# Patient Record
Sex: Male | Born: 1985 | Race: Black or African American | Hispanic: No | Marital: Single | Smoking: Current every day smoker
Health system: Southern US, Community
[De-identification: ages and names within clinical notes are randomized; demographics above are authoritative.]

---

## 2007-12-24 ENCOUNTER — Emergency Department (HOSPITAL_COMMUNITY): Admission: EM | Admit: 2007-12-24 | Discharge: 2007-12-24 | Payer: Self-pay | Admitting: Emergency Medicine

## 2008-01-01 ENCOUNTER — Emergency Department (HOSPITAL_COMMUNITY): Admission: EM | Admit: 2008-01-01 | Discharge: 2008-01-01 | Payer: Self-pay | Admitting: Emergency Medicine

## 2010-03-28 ENCOUNTER — Emergency Department (HOSPITAL_COMMUNITY): Admission: EM | Admit: 2010-03-28 | Discharge: 2010-03-28 | Payer: Self-pay | Admitting: Emergency Medicine

## 2011-03-27 ENCOUNTER — Emergency Department (HOSPITAL_COMMUNITY)
Admission: EM | Admit: 2011-03-27 | Discharge: 2011-03-28 | Disposition: A | Discharge status: Home / Self Care | Payer: Self-pay | Attending: Emergency Medicine | Admitting: Emergency Medicine

## 2011-03-27 DIAGNOSIS — R599 Enlarged lymph nodes, unspecified: Secondary | ICD-10-CM | POA: Insufficient documentation

## 2011-03-27 DIAGNOSIS — R509 Fever, unspecified: Secondary | ICD-10-CM | POA: Insufficient documentation

## 2011-03-27 DIAGNOSIS — J039 Acute tonsillitis, unspecified: Secondary | ICD-10-CM | POA: Insufficient documentation

## 2011-03-27 LAB — DIFFERENTIAL
Eosinophils Absolute: 0 10*3/uL (ref 0.0–0.7)
Lymphs Abs: 1.7 10*3/uL (ref 0.7–4.0)
Monocytes Absolute: 1.5 10*3/uL — ABNORMAL HIGH (ref 0.1–1.0)
Neutro Abs: 7.4 10*3/uL (ref 1.7–7.7)

## 2011-03-28 LAB — CBC
MCH: 25.1 pg — ABNORMAL LOW (ref 26.0–34.0)
RBC: 5.3 MIL/uL — ABNORMAL HIGH (ref 3.87–5.11)
WBC: 10.6 10*3/uL — ABNORMAL HIGH (ref 4.0–10.5)

## 2011-03-28 LAB — MONONUCLEOSIS SCREEN: Mono Screen: NEGATIVE

## 2018-10-15 ENCOUNTER — Emergency Department (HOSPITAL_COMMUNITY)
Admission: EM | Admit: 2018-10-15 | Discharge: 2018-10-15 | Disposition: A | Discharge status: Home / Self Care | Payer: Self-pay | Attending: Emergency Medicine | Admitting: Emergency Medicine

## 2018-10-15 ENCOUNTER — Other Ambulatory Visit: Payer: Self-pay

## 2018-10-15 ENCOUNTER — Encounter (HOSPITAL_COMMUNITY): Payer: Self-pay

## 2018-10-15 ENCOUNTER — Emergency Department (HOSPITAL_COMMUNITY): Payer: Self-pay

## 2018-10-15 DIAGNOSIS — W3400XA Accidental discharge from unspecified firearms or gun, initial encounter: Secondary | ICD-10-CM | POA: Insufficient documentation

## 2018-10-15 DIAGNOSIS — S71131A Puncture wound without foreign body, right thigh, initial encounter: Secondary | ICD-10-CM | POA: Insufficient documentation

## 2018-10-15 DIAGNOSIS — F17210 Nicotine dependence, cigarettes, uncomplicated: Secondary | ICD-10-CM | POA: Insufficient documentation

## 2018-10-15 DIAGNOSIS — Y999 Unspecified external cause status: Secondary | ICD-10-CM | POA: Insufficient documentation

## 2018-10-15 DIAGNOSIS — Y9301 Activity, walking, marching and hiking: Secondary | ICD-10-CM | POA: Insufficient documentation

## 2018-10-15 DIAGNOSIS — S0083XA Contusion of other part of head, initial encounter: Secondary | ICD-10-CM | POA: Insufficient documentation

## 2018-10-15 DIAGNOSIS — Y92410 Unspecified street and highway as the place of occurrence of the external cause: Secondary | ICD-10-CM | POA: Insufficient documentation

## 2018-10-15 LAB — COMPREHENSIVE METABOLIC PANEL
ALBUMIN: 4.4 g/dL (ref 3.5–5.0)
ALT: 19 U/L (ref 0–44)
AST: 36 U/L (ref 15–41)
Alkaline Phosphatase: 48 U/L (ref 38–126)
Anion gap: 18 — ABNORMAL HIGH (ref 5–15)
BUN: 8 mg/dL (ref 6–20)
CHLORIDE: 108 mmol/L (ref 98–111)
CO2: 14 mmol/L — ABNORMAL LOW (ref 22–32)
Calcium: 9.3 mg/dL (ref 8.9–10.3)
Creatinine, Ser: 1.33 mg/dL — ABNORMAL HIGH (ref 0.61–1.24)
GFR calc Af Amer: >60 mL/min (ref >60)
GFR calc non Af Amer: >60 mL/min (ref >60)
GLUCOSE: 137 mg/dL — AB (ref 70–99)
POTASSIUM: 3.2 mmol/L — AB (ref 3.5–5.1)
Sodium: 140 mmol/L (ref 135–145)
Total Bilirubin: 1.7 mg/dL — ABNORMAL HIGH (ref 0.3–1.2)
Total Protein: 7.5 g/dL (ref 6.5–8.1)

## 2018-10-15 LAB — CBC WITH DIFFERENTIAL/PLATELET
ABS IMMATURE GRANULOCYTES: 0.01 10*3/uL (ref 0.00–0.07)
BASOS ABS: 0.1 10*3/uL (ref 0.0–0.1)
BASOS PCT: 1 %
EOS ABS: 0.2 10*3/uL (ref 0.0–0.5)
Eosinophils Relative: 3 %
HCT: 46.6 % (ref 39.0–52.0)
Hemoglobin: 14.2 g/dL (ref 13.0–17.0)
IMMATURE GRANULOCYTES: 0 %
Lymphocytes Relative: 43 %
Lymphs Abs: 3.4 10*3/uL (ref 0.7–4.0)
MCH: 25 pg — ABNORMAL LOW (ref 26.0–34.0)
MCHC: 30.5 g/dL (ref 30.0–36.0)
MCV: 82.2 fL (ref 80.0–100.0)
Monocytes Absolute: 0.7 10*3/uL (ref 0.1–1.0)
Monocytes Relative: 9 %
NEUTROS ABS: 3.5 10*3/uL (ref 1.7–7.7)
NEUTROS PCT: 44 %
PLATELETS: 209 10*3/uL (ref 150–400)
RBC: 5.67 MIL/uL (ref 4.22–5.81)
RDW: 14.1 % (ref 11.5–15.5)
WBC: 7.9 10*3/uL (ref 4.0–10.5)
nRBC: 0 % (ref 0.0–0.2)
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
## 2018-10-15 NOTE — ED Notes (Signed)
E-signature not available, pt verbalized understanding of DC instructions  
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
## 2020-08-07 ENCOUNTER — Encounter (HOSPITAL_COMMUNITY): Payer: Self-pay | Admitting: Emergency Medicine

## 2020-08-07 ENCOUNTER — Emergency Department (HOSPITAL_COMMUNITY)
Admission: EM | Admit: 2020-08-07 | Discharge: 2020-08-07 | Disposition: A | Discharge status: Home / Self Care | Payer: Self-pay | Attending: Emergency Medicine | Admitting: Emergency Medicine

## 2020-08-07 ENCOUNTER — Other Ambulatory Visit: Payer: Self-pay

## 2020-08-07 DIAGNOSIS — M549 Dorsalgia, unspecified: Secondary | ICD-10-CM | POA: Insufficient documentation

## 2020-08-07 DIAGNOSIS — R109 Unspecified abdominal pain: Secondary | ICD-10-CM | POA: Insufficient documentation

## 2020-08-07 DIAGNOSIS — Z5321 Procedure and treatment not carried out due to patient leaving prior to being seen by health care provider: Secondary | ICD-10-CM | POA: Insufficient documentation

## 2020-08-07 LAB — CBC
HCT: 40.2 % (ref 39.0–52.0)
Hemoglobin: 12.6 g/dL — ABNORMAL LOW (ref 13.0–17.0)
MCH: 25.1 pg — ABNORMAL LOW (ref 26.0–34.0)
MCHC: 31.3 g/dL (ref 30.0–36.0)
MCV: 80.2 fL (ref 80.0–100.0)
Platelets: 167 10*3/uL (ref 150–400)
RBC: 5.01 MIL/uL (ref 4.22–5.81)
RDW: 13.5 % (ref 11.5–15.5)
WBC: 4.4 10*3/uL (ref 4.0–10.5)
nRBC: 0 % (ref 0.0–0.2)

## 2020-08-07 LAB — COMPREHENSIVE METABOLIC PANEL
ALT: 20 U/L (ref 0–44)
AST: 29 U/L (ref 15–41)
Albumin: 4.1 g/dL (ref 3.5–5.0)
Alkaline Phosphatase: 54 U/L (ref 38–126)
Anion gap: 10 (ref 5–15)
BUN: 14 mg/dL (ref 6–20)
CO2: 23 mmol/L (ref 22–32)
Calcium: 8.7 mg/dL — ABNORMAL LOW (ref 8.9–10.3)
Chloride: 102 mmol/L (ref 98–111)
Creatinine, Ser: 1.79 mg/dL — ABNORMAL HIGH (ref 0.61–1.24)
GFR, Estimated: 51 mL/min — ABNORMAL LOW (ref >60)
Glucose, Bld: 92 mg/dL (ref 70–99)
Potassium: 3.9 mmol/L (ref 3.5–5.1)
Sodium: 135 mmol/L (ref 135–145)
Total Bilirubin: 0.9 mg/dL (ref 0.3–1.2)
Total Protein: 7.4 g/dL (ref 6.5–8.1)

## 2020-08-07 LAB — URINALYSIS, ROUTINE W REFLEX MICROSCOPIC
Bacteria, UA: NONE SEEN
Bilirubin Urine: NEGATIVE
Glucose, UA: NEGATIVE mg/dL
Hgb urine dipstick: NEGATIVE
Ketones, ur: 5 mg/dL — AB
Nitrite: NEGATIVE
Protein, ur: 30 mg/dL — AB
Specific Gravity, Urine: 1.032 — ABNORMAL HIGH (ref 1.005–1.030)
pH: 5 (ref 5.0–8.0)

## 2020-08-07 NOTE — ED Triage Notes (Signed)
Patient has had L flank/ back pain x 2.5 - 3 weeks. States he tried drinking cranberry juice w/o relief. Denies urinary issues, pain is reproducible to palpation.

## 2020-08-08 ENCOUNTER — Encounter (HOSPITAL_COMMUNITY): Payer: Self-pay

## 2020-08-08 ENCOUNTER — Ambulatory Visit (HOSPITAL_COMMUNITY)
Admission: RE | Admit: 2020-08-08 | Discharge: 2020-08-08 | Disposition: A | Discharge status: Home / Self Care | Payer: Self-pay | Source: Ambulatory Visit | Attending: Family Medicine | Admitting: Family Medicine

## 2020-08-08 VITALS — BP 135/93 | HR 72 | Temp 100.2°F | Resp 16

## 2020-08-08 DIAGNOSIS — N390 Urinary tract infection, site not specified: Secondary | ICD-10-CM | POA: Insufficient documentation

## 2020-08-08 DIAGNOSIS — R109 Unspecified abdominal pain: Secondary | ICD-10-CM | POA: Insufficient documentation

## 2020-08-08 LAB — CBC WITH DIFFERENTIAL/PLATELET
Abs Immature Granulocytes: 0.01 10*3/uL (ref 0.00–0.07)
Basophils Absolute: 0 10*3/uL (ref 0.0–0.1)
Basophils Relative: 1 %
Eosinophils Absolute: 0 10*3/uL (ref 0.0–0.5)
Eosinophils Relative: 0 %
HCT: 41.5 % (ref 39.0–52.0)
Hemoglobin: 13.2 g/dL (ref 13.0–17.0)
Immature Granulocytes: 0 %
Lymphocytes Relative: 37 %
Lymphs Abs: 1.5 10*3/uL (ref 0.7–4.0)
MCH: 24.8 pg — ABNORMAL LOW (ref 26.0–34.0)
MCHC: 31.8 g/dL (ref 30.0–36.0)
MCV: 77.9 fL — ABNORMAL LOW (ref 80.0–100.0)
Monocytes Absolute: 0.6 10*3/uL (ref 0.1–1.0)
Monocytes Relative: 16 %
Neutro Abs: 1.9 10*3/uL (ref 1.7–7.7)
Neutrophils Relative %: 46 %
Platelets: 174 10*3/uL (ref 150–400)
RBC: 5.33 MIL/uL (ref 4.22–5.81)
RDW: 13.1 % (ref 11.5–15.5)
WBC: 4 10*3/uL (ref 4.0–10.5)
nRBC: 0 % (ref 0.0–0.2)

## 2020-08-08 LAB — POCT URINALYSIS DIPSTICK, ED / UC
Bilirubin Urine: NEGATIVE
Glucose, UA: NEGATIVE mg/dL
Ketones, ur: >=160 mg/dL — AB
Nitrite: NEGATIVE
Protein, ur: NEGATIVE mg/dL
Specific Gravity, Urine: 1.025 (ref 1.005–1.030)
Urobilinogen, UA: 0.2 mg/dL (ref 0.0–1.0)
pH: 5.5 (ref 5.0–8.0)

## 2020-08-08 MED ORDER — KETOROLAC TROMETHAMINE 60 MG/2ML IM SOLN
INTRAMUSCULAR | Status: AC
  Filled 2020-08-08: qty 2

## 2020-08-08 MED ORDER — TAMSULOSIN HCL 0.4 MG PO CAPS: 0.4000 mg | ORAL_CAPSULE | Freq: Every day | ORAL | 0 refills | Status: DC

## 2020-08-08 MED ORDER — KETOROLAC TROMETHAMINE 60 MG/2ML IM SOLN
60.0000 mg | Freq: Once | INTRAMUSCULAR | Status: AC
  Administered 2020-08-08: 60 mg via INTRAMUSCULAR

## 2020-08-08 MED ORDER — SULFAMETHOXAZOLE-TRIMETHOPRIM 800-160 MG PO TABS
1.0000 | ORAL_TABLET | Freq: Two times a day (BID) | ORAL | 0 refills | Status: AC
Start: 2020-08-08 — End: 2020-08-15

## 2020-08-08 NOTE — ED Provider Notes (Signed)
[facility]    CSN: [id number] Arrival date & time: 08/08/20  1634      History   Chief Complaint Chief Complaint  Patient presents with  . Back Pain    HPI Jose Garza is a 34 y.o. male.   Presenting today with 2 weeks of progressively worsening left flank pain and now today fever. Denies abdominal pain, dysuria, hematuria, concern for STI, N/V/D, CP, SOB, injury. Has been taking OTC pain relievers with minimal relief. No history of similar episodes.      History reviewed. No pertinent past medical history.  There are no problems to display for this patient.   History reviewed. No pertinent surgical history.     Home Medications    Prior to Admission medications   Medication Sig Start Date End Date Taking? Authorizing Provider  sulfamethoxazole-trimethoprim (BACTRIM DS) 800-160 MG tablet Take 1 tablet by mouth 2 (two) times daily for 7 days. 08/08/20 08/15/20  Particia Nearing, PA-C  tamsulosin (FLOMAX) 0.4 MG CAPS capsule Take 1 capsule (0.4 mg total) by mouth daily. 08/08/20   Particia Nearing, PA-C    Family History History reviewed. No pertinent family history.  Social History Social History   Tobacco Use  . Smoking status: Current Every Day Smoker    Packs/day: 0.25    Types: Cigarettes  . Smokeless tobacco: Never Used  Substance Use Topics  . Alcohol use: Never  . Drug use: Never     Allergies   Patient has no known allergies.   Review of Systems Review of Systems PER HPI   Physical Exam Triage Vital Signs ED Triage Vitals  Enc Vitals Group     BP 08/08/20 1656 (!) 135/93     Pulse Rate 08/08/20 1656 72     Resp 08/08/20 1656 16     Temp 08/08/20 1656 100.2 F (37.9 C)     Temp Source 08/08/20 1656 Oral     SpO2 08/08/20 1656 99 %     Weight --      Height --      Head Circumference --      Peak Flow --      Pain Score 08/08/20 1653 9     Pain Loc --      Pain Edu? --      Excl. in GC? --    No data  found.  Updated Vital Signs BP (!) 135/93 (BP Location: Right Arm)   Pulse 72   Temp 100.2 F (37.9 C) (Oral)   Resp 16   SpO2 99%   Visual Acuity Right Eye Distance:   Left Eye Distance:   Bilateral Distance:    Right Eye Near:   Left Eye Near:    Bilateral Near:     Physical Exam Vitals and nursing note reviewed.  Constitutional:      Appearance: Normal appearance.  HENT:     Head: Atraumatic.     Mouth/Throat:     Mouth: Mucous membranes are moist.     Pharynx: Oropharynx is clear.  Eyes:     Extraocular Movements: Extraocular movements intact.     Conjunctiva/sclera: Conjunctivae normal.  Cardiovascular:     Rate and Rhythm: Normal rate and regular rhythm.  Pulmonary:     Effort: Pulmonary effort is normal.     Breath sounds: Normal breath sounds.  Abdominal:     General: Bowel sounds are normal. There is no distension.     Palpations: Abdomen is soft.  Tenderness: There is no abdominal tenderness. There is left CVA tenderness. There is no right CVA tenderness or guarding.  Musculoskeletal:        General: Normal range of motion.     Cervical back: Normal range of motion and neck supple.  Skin:    General: Skin is warm and dry.  Neurological:     General: No focal deficit present.     Mental Status: He is oriented to person, place, and time.  Psychiatric:        Mood and Affect: Mood normal.        Thought Content: Thought content normal.        Judgment: Judgment normal.      UC Treatments / Results  Labs (all labs ordered are listed, but only abnormal results are displayed) Labs Reviewed  POCT URINALYSIS DIPSTICK, ED / UC - Abnormal; Notable for the following components:      Result Value   Ketones, ur >=160 (*)    Hgb urine dipstick LARGE (*)    Leukocytes,Ua TRACE (*)    All other components within normal limits  URINE CULTURE  CBC WITH DIFFERENTIAL/PLATELET    EKG   Radiology No results found.  Procedures Procedures (including  critical care time)  Medications Ordered in UC Medications  ketorolac (TORADOL) injection 60 mg (has no administration in time range)    Initial Impression / Assessment and Plan / UC Course  I have reviewed the triage vital signs and the nursing notes.  Pertinent labs & imaging results that were available during my care of the patient were reviewed by me and considered in my medical decision making (see chart for details).     U/A with signs suspicious of UTI, possibly pyelonephritis vs kidney stone with subsequent UTI. Will treat with bactrim, flomax, fluids and IM toradol/OTC pain relievers at home. CBC and urine culture pending. Discussed strict ED precautions if worsening.   Final Clinical Impressions(s) / UC Diagnoses   Final diagnoses:  Lower urinary tract infectious disease  Left flank pain   Discharge Instructions   None    ED Prescriptions    Medication Sig Dispense Auth. Provider   sulfamethoxazole-trimethoprim (BACTRIM DS) 800-160 MG tablet Take 1 tablet by mouth 2 (two) times daily for 7 days. 14 tablet Particia Nearing, [place]   tamsulosin (FLOMAX) 0.4 MG CAPS capsule Take 1 capsule (0.4 mg total) by mouth daily. 14 capsule Particia Nearing, [place]     PDMP not reviewed this encounter.   Particia Nearing, [place] 08/08/20 [phone]

## 2020-08-08 NOTE — ED Triage Notes (Addendum)
Pt presents with low back pain xs 2-3 weeks. States has gotten severe in the past 2-3 days. States was seen in ED last night but left before being seen due to weight time. States gave urine sample and blood work last night.

## 2020-08-10 LAB — URINE CULTURE: Culture: NO GROWTH

## 2021-02-08 ENCOUNTER — Emergency Department (HOSPITAL_COMMUNITY): Payer: Self-pay

## 2021-02-08 ENCOUNTER — Other Ambulatory Visit: Payer: Self-pay

## 2021-02-08 ENCOUNTER — Emergency Department (HOSPITAL_COMMUNITY)
Admission: EM | Admit: 2021-02-08 | Discharge: 2021-02-08 | Disposition: A | Discharge status: Home / Self Care | Payer: Self-pay | Attending: Emergency Medicine | Admitting: Emergency Medicine

## 2021-02-08 ENCOUNTER — Encounter (HOSPITAL_COMMUNITY): Payer: Self-pay | Admitting: Emergency Medicine

## 2021-02-08 DIAGNOSIS — S61412A Laceration without foreign body of left hand, initial encounter: Secondary | ICD-10-CM | POA: Insufficient documentation

## 2021-02-08 DIAGNOSIS — W311XXA Contact with metalworking machines, initial encounter: Secondary | ICD-10-CM | POA: Insufficient documentation

## 2021-02-08 DIAGNOSIS — Y92000 Kitchen of unspecified non-institutional (private) residence as  the place of occurrence of the external cause: Secondary | ICD-10-CM | POA: Insufficient documentation

## 2021-02-08 DIAGNOSIS — F17210 Nicotine dependence, cigarettes, uncomplicated: Secondary | ICD-10-CM | POA: Insufficient documentation

## 2021-02-08 MED ORDER — LIDOCAINE-EPINEPHRINE (PF) 2 %-1:200000 IJ SOLN
20.0000 mL | Freq: Once | INTRAMUSCULAR | Status: AC
  Administered 2021-02-08: 20 mL
  Filled 2021-02-08: qty 20

## 2021-02-08 NOTE — ED Triage Notes (Signed)
Pt states working on kitchen when he cut the posterior right hand with metal machine. Presents with 1 inch laceration. Bleeding controlled. Sensation and movement intact. Last tetanus 5 years.
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
## 2021-09-09 ENCOUNTER — Encounter (HOSPITAL_COMMUNITY): Payer: Self-pay

## 2021-09-09 ENCOUNTER — Ambulatory Visit (HOSPITAL_COMMUNITY)
Admission: EM | Admit: 2021-09-09 | Discharge: 2021-09-09 | Disposition: A | Discharge status: Home / Self Care | Payer: Self-pay | Attending: Physician Assistant | Admitting: Physician Assistant

## 2021-09-09 ENCOUNTER — Other Ambulatory Visit: Payer: Self-pay

## 2021-09-09 ENCOUNTER — Ambulatory Visit (INDEPENDENT_AMBULATORY_CARE_PROVIDER_SITE_OTHER): Payer: Self-pay

## 2021-09-09 DIAGNOSIS — M545 Low back pain, unspecified: Secondary | ICD-10-CM

## 2021-09-09 LAB — POCT URINALYSIS DIPSTICK, ED / UC
Bilirubin Urine: NEGATIVE
Glucose, UA: NEGATIVE mg/dL
Hgb urine dipstick: NEGATIVE
Ketones, ur: NEGATIVE mg/dL
Leukocytes,Ua: NEGATIVE
Nitrite: NEGATIVE
Protein, ur: NEGATIVE mg/dL
Specific Gravity, Urine: >=1.03 (ref 1.005–1.030)
Urobilinogen, UA: 0.2 mg/dL (ref 0.0–1.0)
pH: 7 (ref 5.0–8.0)

## 2021-09-09 MED ORDER — METHYLPREDNISOLONE ACETATE 40 MG/ML IJ SUSP
60.0000 mg | Freq: Once | INTRAMUSCULAR | Status: AC
  Administered 2021-09-09: 60 mg via INTRAMUSCULAR

## 2021-09-09 MED ORDER — METHOCARBAMOL 500 MG PO TABS: 500.0000 mg | ORAL_TABLET | Freq: Three times a day (TID) | ORAL | 0 refills | Status: AC | PRN

## 2021-09-09 MED ORDER — DICLOFENAC SODIUM 75 MG PO TBEC: 75.0000 mg | DELAYED_RELEASE_TABLET | Freq: Two times a day (BID) | ORAL | 0 refills | Status: AC

## 2021-09-09 MED ORDER — METHYLPREDNISOLONE SODIUM SUCC 125 MG IJ SOLR
INTRAMUSCULAR | Status: AC
  Filled 2021-09-09: qty 2

## 2021-09-09 NOTE — Discharge Instructions (Signed)
Your x-ray does show some degenerative changes.  I recommend you follow-up with orthopedics as you may need an MRI and/or physical therapy.  Please call to schedule an appointment.  We gave you an injection of steroids today.  Please start diclofenac for additional inflammation and pain relief.  Do not take NSAIDs including aspirin, ibuprofen/Advil, naproxen/Aleve with this medication as it can cause stomach bleeding.  Use methocarbamol up to 3 times a day as needed.  This will make you sleepy so do not drive or drink alcohol while taking it.  Use heat, rest, stretch for additional symptom relief.  If you have any sudden severe pain, lower extremity weakness, numbness in the inside of your legs, difficulty walking, going to the bathroom on yourself without noticing it or trouble going to the bathroom you need to go to the emergency room.

## 2021-09-09 NOTE — ED Triage Notes (Signed)
Pt c/o lower back pain radiating down lt buttocks x5 days. Denies injury. States using OTC meds with no relief.
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
## 2022-07-15 IMAGING — DX DG LUMBAR SPINE COMPLETE 4+V
4 series · 4 of 4 positions shown · non-contrast
Comparison: None.

CLINICAL DATA: Pain percussion of vertebrae

EXAM:
LUMBAR SPINE - COMPLETE 4+ VIEW

[l-spine ap]
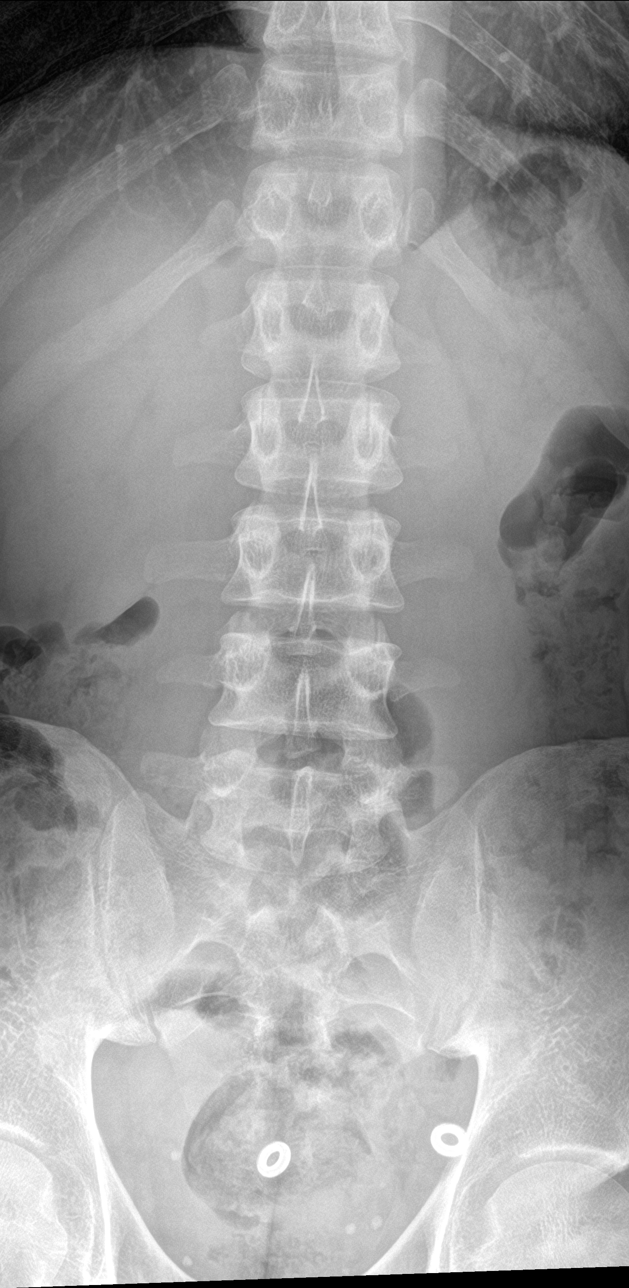

[l-spine obl (1 of 2)]
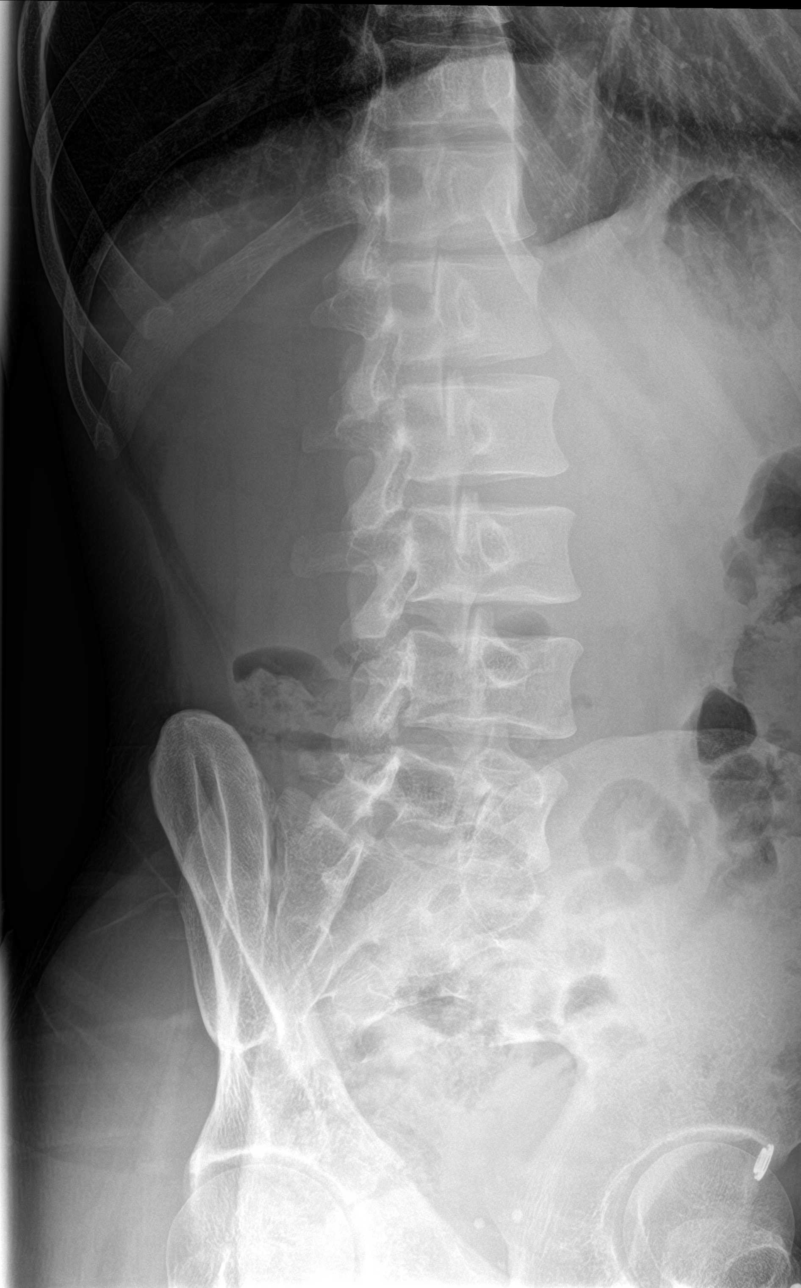

[l-spine obl (2 of 2)]
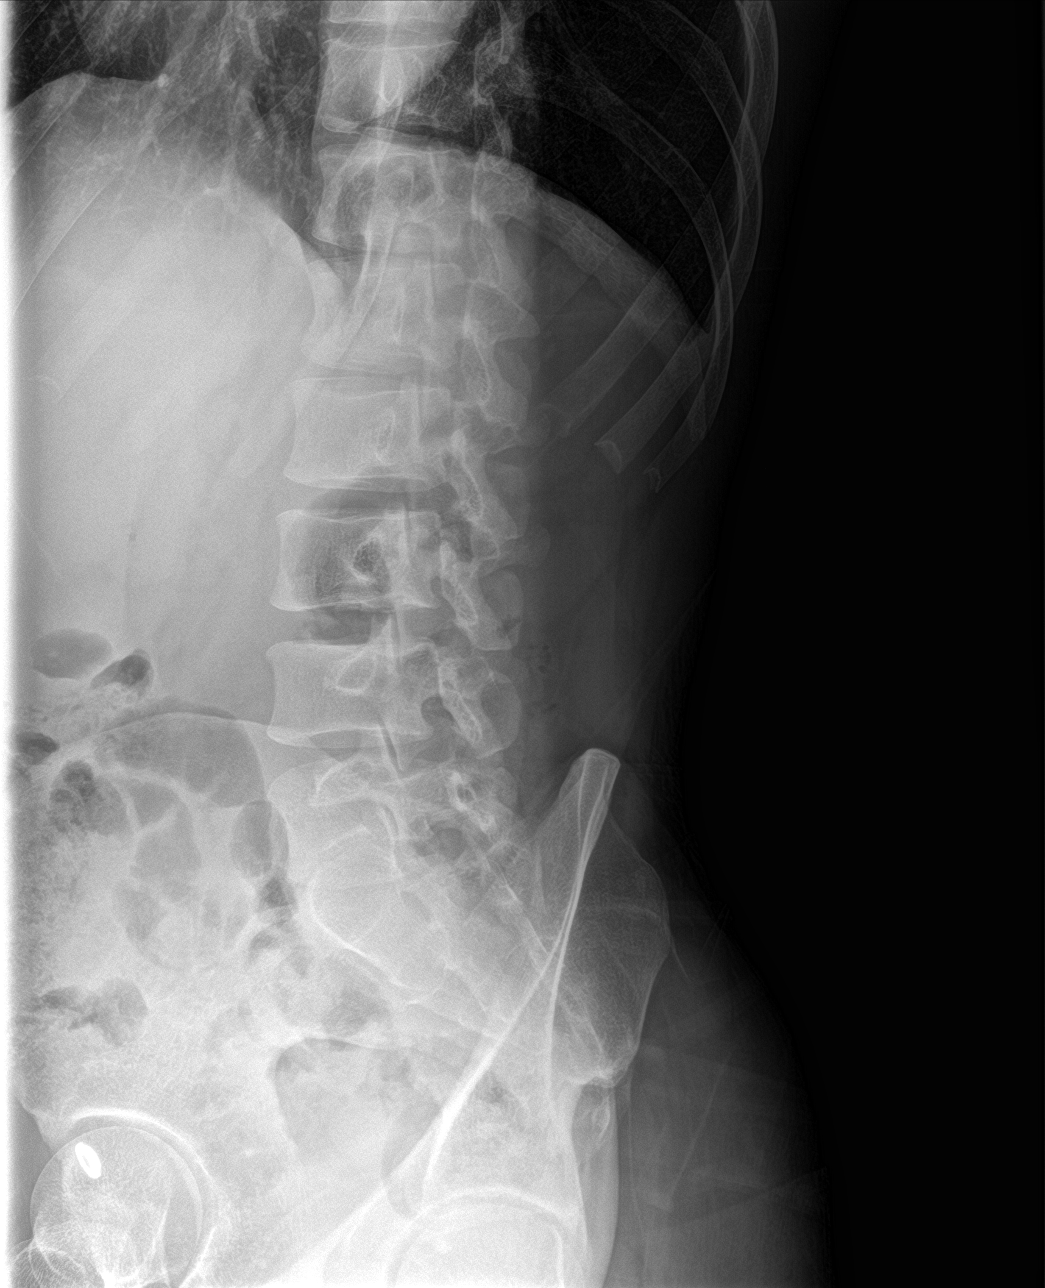

[l-spine lat]
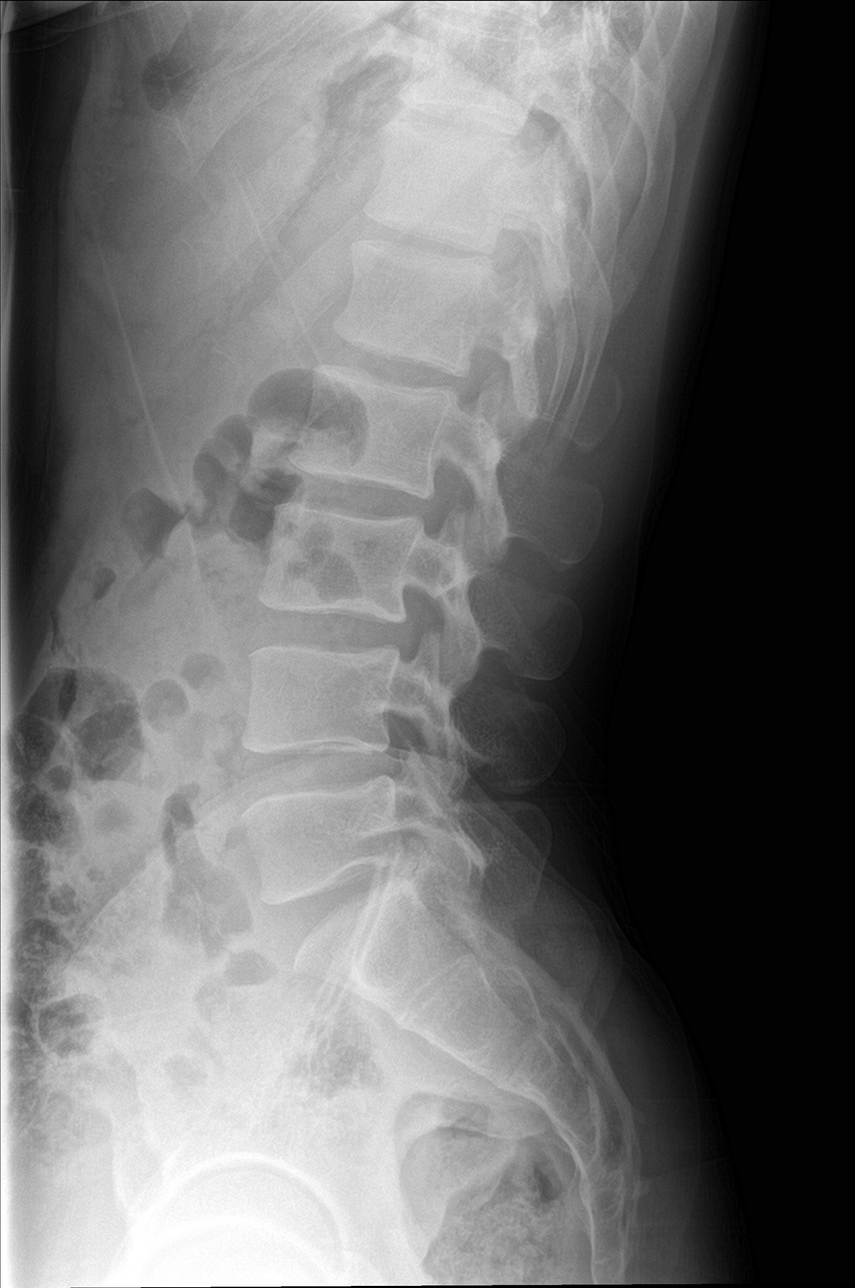

[4 of 4 positions shown; findings below may reference images not displayed]

FINDINGS: Five lumbar type vertebral bodies. Vertebral body heights are
maintained without evidence of acute fracture. Normal alignment.
Mild intervertebral disc height loss at L5-S1. L5-S1 facet
arthropathy with possible bony foraminal stenosis.
IMPRESSION: 1. No evidence of acute fracture or malalignment.
2. Mild degenerative change at L5-S1 with possible foraminal
stenosis. MRI could further characterize if clinically indicated.

## 2024-09-29 ENCOUNTER — Emergency Department (HOSPITAL_COMMUNITY)
Admission: EM | Admit: 2024-09-29 | Discharge: 2024-09-29 | Disposition: A | Discharge status: Home / Self Care | Payer: Self-pay | Attending: Emergency Medicine | Admitting: Emergency Medicine

## 2024-09-29 ENCOUNTER — Emergency Department (HOSPITAL_COMMUNITY): Payer: Self-pay

## 2024-09-29 ENCOUNTER — Other Ambulatory Visit: Payer: Self-pay

## 2024-09-29 DIAGNOSIS — Y249XXA Unspecified firearm discharge, undetermined intent, initial encounter: Secondary | ICD-10-CM | POA: Insufficient documentation

## 2024-09-29 DIAGNOSIS — S92301B Fracture of unspecified metatarsal bone(s), right foot, initial encounter for open fracture: Secondary | ICD-10-CM | POA: Insufficient documentation

## 2024-09-29 LAB — CBC WITH DIFFERENTIAL/PLATELET
Abs Immature Granulocytes: 0.01 10*3/uL (ref 0.00–0.07)
Basophils Absolute: 0.1 10*3/uL (ref 0.0–0.1)
Basophils Relative: 1 %
Eosinophils Absolute: 0.2 10*3/uL (ref 0.0–0.5)
Eosinophils Relative: 3 %
HCT: 44.8 % (ref 39.0–52.0)
Hemoglobin: 14.2 g/dL (ref 13.0–17.0)
Immature Granulocytes: 0 %
Lymphocytes Relative: 32 %
Lymphs Abs: 1.9 10*3/uL (ref 0.7–4.0)
MCH: 25.4 pg — ABNORMAL LOW (ref 26.0–34.0)
MCHC: 31.7 g/dL (ref 30.0–36.0)
MCV: 80.1 fL (ref 80.0–100.0)
Monocytes Absolute: 0.4 10*3/uL (ref 0.1–1.0)
Monocytes Relative: 6 %
Neutro Abs: 3.4 10*3/uL (ref 1.7–7.7)
Neutrophils Relative %: 58 %
Platelets: 284 10*3/uL (ref 150–400)
RBC: 5.59 MIL/uL (ref 4.22–5.81)
RDW: 13.2 % (ref 11.5–15.5)
WBC: 5.9 10*3/uL (ref 4.0–10.5)
nRBC: 0 % (ref 0.0–0.2)

## 2024-09-29 LAB — BASIC METABOLIC PANEL WITH GFR
Anion gap: 14 (ref 5–15)
BUN: 10 mg/dL (ref 6–20)
CO2: 24 mmol/L (ref 22–32)
Calcium: 9.2 mg/dL (ref 8.9–10.3)
Chloride: 102 mmol/L (ref 98–111)
Creatinine, Ser: 1.12 mg/dL (ref 0.61–1.24)
GFR, Estimated: >60 mL/min
Glucose, Bld: 101 mg/dL — ABNORMAL HIGH (ref 70–99)
Potassium: 3.5 mmol/L (ref 3.5–5.1)
Sodium: 139 mmol/L (ref 135–145)

## 2024-09-29 MED ORDER — OXYCODONE HCL 5 MG PO TABS
5.0000 mg | ORAL_TABLET | Freq: Once | ORAL | Status: AC
  Administered 2024-09-29: 5 mg via ORAL
  Filled 2024-09-29: qty 1

## 2024-09-29 MED ORDER — OXYCODONE HCL 5 MG PO TABS: 5.0000 mg | ORAL_TABLET | Freq: Four times a day (QID) | ORAL | 0 refills | Status: AC | PRN

## 2024-09-29 MED ORDER — FENTANYL CITRATE (PF) 50 MCG/ML IJ SOSY
50.0000 ug | PREFILLED_SYRINGE | Freq: Once | INTRAMUSCULAR | Status: AC
  Administered 2024-09-29: 50 ug via INTRAVENOUS
  Filled 2024-09-29: qty 1

## 2024-09-29 MED ORDER — CEPHALEXIN 500 MG PO CAPS: 500.0000 mg | ORAL_CAPSULE | Freq: Four times a day (QID) | ORAL | 0 refills | Status: AC

## 2024-09-29 MED ORDER — IBUPROFEN 600 MG PO TABS: 600.0000 mg | ORAL_TABLET | Freq: Three times a day (TID) | ORAL | 0 refills | Status: AC | PRN

## 2024-09-29 MED ORDER — CEFAZOLIN SODIUM-DEXTROSE 2-4 GM/100ML-% IV SOLN
2.0000 g | Freq: Once | INTRAVENOUS | Status: AC
  Administered 2024-09-29: 2 g via INTRAVENOUS
  Filled 2024-09-29: qty 100

## 2024-09-29 NOTE — ED Notes (Signed)
 Splinting complete, taken to imaging.
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
## 2024-09-29 NOTE — ED Notes (Signed)
 Bandages removed by edp.

## 2024-09-29 NOTE — ED Provider Notes (Addendum)
 Signed out to d/c to home after ct.    CT completed.   Recheck pt comfortable. No 'pain out of proportion', pain is controlled. Normal cap refill in toes. Normal movement of toes comfortably. Motor/sens grossly intact.   Pt currently appears stable for d/c per Dr Freddi and ortho plan.      Bernard Drivers, MD 09/29/24 1745
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
## 2024-09-29 NOTE — Progress Notes (Signed)
 Orthopedic Tech Progress Note Patient Details:  Jose Garza 1986/06/25 [id number]  Wound care was completed and a non-adhesive dressing was applied to the RLE by Dorn, RN.  Ortho Devices Type of Ortho Device: Crutches, Other (comment) (bulky jones splint) Ortho Device/Splint Location: RLE, crutches at bedside Ortho Device/Splint Interventions: Ordered, Application, Adjustment   Post Interventions Patient Tolerated: Well Instructions Provided: Care of device, Adjustment of device, Poper ambulation with device  Callan Norden Ronal Brasil 09/29/2024, 4:32 PM
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
## 2024-09-29 NOTE — ED Notes (Signed)
 Edp at bedside

## 2024-09-29 NOTE — Discharge Instructions (Addendum)
 We are given a splint to protect your foot.  Elevate foot to help with swelling. You are not allowed to put any weight on your right leg and must use crutches anytime you are up.  Follow-up with the orthopedist in the coming week, call their office tomorrow for an appointment next week.  We are putting you on antibiotics, take as prescribed. .  We are prescribing you pain medicine as well.  Do not drive or operate heavy machinery while on these meds.  Do not combine with alcohol. Do not drive when taking.   Return to the ER for any new or worsening symptoms, severe pain, numbness/weakness, or other concern.
